# Patient Record
Sex: Female | Born: 2000 | Race: Black or African American | Hispanic: No | Smoking: Never smoker
Health system: Southern US, Community
[De-identification: ages and names within clinical notes are randomized; demographics above are authoritative.]

## PROBLEM LIST (undated history)

## (undated) DIAGNOSIS — L309 Dermatitis, unspecified: Secondary | ICD-10-CM

## (undated) HISTORY — PX: TONSILLECTOMY: SUR1361

---
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
## 2022-03-17 ENCOUNTER — Ambulatory Visit (HOSPITAL_COMMUNITY)
Admission: EM | Admit: 2022-03-17 | Discharge: 2022-03-17 | Disposition: A | Discharge status: Home / Self Care | Payer: Medicaid Other

## 2022-03-17 ENCOUNTER — Emergency Department (HOSPITAL_COMMUNITY)
Admission: EM | Admit: 2022-03-17 | Discharge: 2022-03-17 | Discharge status: Against Medical Advice | Payer: Medicaid Other | Attending: Emergency Medicine | Admitting: Emergency Medicine

## 2022-03-17 ENCOUNTER — Encounter (HOSPITAL_COMMUNITY): Payer: Self-pay | Admitting: Emergency Medicine

## 2022-03-17 ENCOUNTER — Other Ambulatory Visit: Payer: Self-pay

## 2022-03-17 DIAGNOSIS — H538 Other visual disturbances: Secondary | ICD-10-CM | POA: Insufficient documentation

## 2022-03-17 DIAGNOSIS — Z5321 Procedure and treatment not carried out due to patient leaving prior to being seen by health care provider: Secondary | ICD-10-CM | POA: Diagnosis not present

## 2022-03-17 DIAGNOSIS — R519 Headache, unspecified: Secondary | ICD-10-CM | POA: Insufficient documentation
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
## 2022-04-13 ENCOUNTER — Encounter (HOSPITAL_COMMUNITY): Payer: Self-pay

## 2022-04-13 ENCOUNTER — Ambulatory Visit (HOSPITAL_COMMUNITY)
Admission: EM | Admit: 2022-04-13 | Discharge: 2022-04-13 | Disposition: A | Discharge status: Home / Self Care | Payer: Medicaid Other | Attending: Physician Assistant | Admitting: Physician Assistant

## 2022-04-13 DIAGNOSIS — N898 Other specified noninflammatory disorders of vagina: Secondary | ICD-10-CM | POA: Diagnosis not present

## 2022-04-13 DIAGNOSIS — Z202 Contact with and (suspected) exposure to infections with a predominantly sexual mode of transmission: Secondary | ICD-10-CM | POA: Diagnosis not present

## 2022-04-13 LAB — HIV ANTIBODY (ROUTINE TESTING W REFLEX): HIV Screen 4th Generation wRfx: NONREACTIVE

## 2022-04-13 NOTE — ED Provider Notes (Signed)
[facility]    CSN: [id number] Arrival date & time: 04/13/22  3009      History   Chief Complaint Chief Complaint  Patient presents with   Exposure to STD    HPI Adriana Powell is a 21 y.o. female.   21 year old female presents for STI testing.  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  Patient relates that she is concerned because her partner is bisexual.  Patient relates that she has been having some vaginal discharge over the past several days which is white and thin, no odor, no itching.  Patient relates that her last period was at the end of April, and it was normal for her.  Patient does indicate that she has some irritation at the external vaginal area, she relates it may be secondary to some hair bump irritation, but she desires to have this checked because she is concerned about HSV.  No fever or chills.  No dysuria, or frequency.   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 Authorizing Provider  bacitracin ointment Apply 1 application topically 2 (two) times daily. 08/31/21   White, Elita Boone, NP  diphenhydrAMINE HCl 50 MG/30ML LIQD Take 15 mLs by mouth 4 (four) times daily as needed (Itching). 09/16/21   Theadora Rama Scales, PA-C  metroNIDAZOLE (FLAGYL) 500 MG tablet Take 1 tablet (500 mg total) by mouth 2 (two) times  daily. 09/01/21   Lamptey, Britta Mccreedy, MD  triamcinolone cream (KENALOG) 0.1 % Apply 1 application topically 2 (two) times daily. 05/09/21   Ivette Loyal, NP    Family History Family History  Problem Relation Age of Onset   Diabetes Paternal Grandmother     Social History Social History   Tobacco Use   Smoking status: Never    Passive exposure: Yes   Smokeless tobacco: Never  Vaping Use   Vaping Use: Never used  Substance Use Topics   Alcohol use: No   Drug use: No     Allergies   Hydrocodone   Review of Systems Review of Systems  Genitourinary:  Positive for genital sores and vaginal discharge.    Physical Exam Triage Vital Signs ED Triage Vitals  Enc Vitals Group     BP 04/13/22 1028 117/77     Pulse Rate 04/13/22 1028 60     Resp 04/13/22 1028 17     Temp 04/13/22 1028 98 F (36.7 C)     Temp src --      SpO2 04/13/22 1028 99 %     Weight --  Height --      Head Circumference --      Peak Flow --      Pain Score 04/13/22 1027 0     Pain Loc --      Pain Edu? --      Excl. in GC? --    No data found.  Updated Vital Signs BP 117/77   Pulse 60   Temp 98 F (36.7 C)   Resp 17   LMP 04/07/2022   SpO2 99%   Visual Acuity Right Eye Distance:   Left Eye Distance:   Bilateral Distance:    Right Eye Near:   Left Eye Near:    Bilateral Near:     Physical Exam Constitutional:      Appearance: Normal appearance.  Abdominal:     General: Abdomen is flat. Bowel sounds are normal.     Palpations: Abdomen is soft.     Tenderness: There is no abdominal tenderness. There is no guarding or rebound.  Genitourinary:      Comments: Pelvic: The right upper labia majora outer aspect there is a very small 2 mm x 1 mm braised area,Minimal redness,No drainage,rest of the exterior exam is normal. Neurological:     Mental Status: She is alert.  Psychiatric:        Behavior: Behavior is cooperative.     UC Treatments / Results  Labs (all labs  ordered are listed, but only abnormal results are displayed) Labs Reviewed  RPR  HIV ANTIBODY (ROUTINE TESTING W REFLEX)  CERVICOVAGINAL ANCILLARY ONLY    EKG   Radiology No results found.  Procedures Procedures (including critical care time)  Medications Ordered in UC Medications - No data to display  Initial Impression / Assessment and Plan / UC Course  I have reviewed the triage vital signs and the nursing notes.  Pertinent labs & imaging results that were available during my care of the patient were reviewed by me and considered in my medical decision making (see chart for details).    Plan: 1.  Advised to use precautions when engaging in sexual activity. 2.  Check MyChart in 48 hours to review results. 3.  Turn if symptoms fail to improve. Final Clinical Impressions(s) / UC Diagnoses   Final diagnoses:  STD exposure  Vaginal discharge   Discharge Instructions   None    ED Prescriptions   None    PDMP not reviewed this encounter.   Ellsworth Lennox, PA-C 04/13/22 1116

## 2022-04-13 NOTE — Discharge Instructions (Addendum)
Advised to continue to watch and be aware of any unusual symptoms.
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
## 2022-04-14 ENCOUNTER — Other Ambulatory Visit: Payer: Self-pay

## 2022-04-14 ENCOUNTER — Emergency Department (HOSPITAL_COMMUNITY)
Admission: EM | Admit: 2022-04-14 | Discharge: 2022-04-15 | Disposition: A | Discharge status: Home / Self Care | Payer: Medicaid Other | Attending: Emergency Medicine | Admitting: Emergency Medicine

## 2022-04-14 DIAGNOSIS — Z20822 Contact with and (suspected) exposure to covid-19: Secondary | ICD-10-CM | POA: Diagnosis not present

## 2022-04-14 DIAGNOSIS — R112 Nausea with vomiting, unspecified: Secondary | ICD-10-CM | POA: Insufficient documentation

## 2022-04-14 DIAGNOSIS — R45851 Suicidal ideations: Secondary | ICD-10-CM | POA: Diagnosis not present

## 2022-04-14 DIAGNOSIS — T391X2A Poisoning by 4-Aminophenol derivatives, intentional self-harm, initial encounter: Secondary | ICD-10-CM | POA: Insufficient documentation

## 2022-04-14 DIAGNOSIS — T50902A Poisoning by unspecified drugs, medicaments and biological substances, intentional self-harm, initial encounter: Secondary | ICD-10-CM

## 2022-04-14 DIAGNOSIS — D649 Anemia, unspecified: Secondary | ICD-10-CM | POA: Insufficient documentation

## 2022-04-14 DIAGNOSIS — T1491XA Suicide attempt, initial encounter: Secondary | ICD-10-CM

## 2022-04-14 DIAGNOSIS — T402X2A Poisoning by other opioids, intentional self-harm, initial encounter: Secondary | ICD-10-CM | POA: Insufficient documentation

## 2022-04-14 DIAGNOSIS — F322 Major depressive disorder, single episode, severe without psychotic features: Secondary | ICD-10-CM | POA: Diagnosis present

## 2022-04-14 LAB — CBC WITH DIFFERENTIAL/PLATELET
Abs Immature Granulocytes: 0.01 10*3/uL (ref 0.00–0.07)
Basophils Absolute: 0 10*3/uL (ref 0.0–0.1)
Basophils Relative: 0 %
Eosinophils Absolute: 0.1 10*3/uL (ref 0.0–0.5)
Eosinophils Relative: 1 %
HCT: 34.5 % — ABNORMAL LOW (ref 36.0–46.0)
Hemoglobin: 11 g/dL — ABNORMAL LOW (ref 12.0–15.0)
Immature Granulocytes: 0 %
Lymphocytes Relative: 38 %
Lymphs Abs: 2.6 10*3/uL (ref 0.7–4.0)
MCH: 28.1 pg (ref 26.0–34.0)
MCHC: 31.9 g/dL (ref 30.0–36.0)
MCV: 88.2 fL (ref 80.0–100.0)
Monocytes Absolute: 0.5 10*3/uL (ref 0.1–1.0)
Monocytes Relative: 7 %
Neutro Abs: 3.8 10*3/uL (ref 1.7–7.7)
Neutrophils Relative %: 54 %
Platelets: 268 10*3/uL (ref 150–400)
RBC: 3.91 MIL/uL (ref 3.87–5.11)
RDW: 13.2 % (ref 11.5–15.5)
WBC: 7 10*3/uL (ref 4.0–10.5)
nRBC: 0 % (ref 0.0–0.2)

## 2022-04-14 LAB — COMPREHENSIVE METABOLIC PANEL
ALT: 12 U/L (ref 0–44)
AST: 15 U/L (ref 15–41)
Albumin: 3.5 g/dL (ref 3.5–5.0)
Alkaline Phosphatase: 57 U/L (ref 38–126)
Anion gap: 6 (ref 5–15)
BUN: 10 mg/dL (ref 6–20)
CO2: 25 mmol/L (ref 22–32)
Calcium: 8.7 mg/dL — ABNORMAL LOW (ref 8.9–10.3)
Chloride: 108 mmol/L (ref 98–111)
Creatinine, Ser: 0.79 mg/dL (ref 0.44–1.00)
GFR, Estimated: >60 mL/min (ref >60)
Glucose, Bld: 94 mg/dL (ref 70–99)
Potassium: 3.4 mmol/L — ABNORMAL LOW (ref 3.5–5.1)
Sodium: 139 mmol/L (ref 135–145)
Total Bilirubin: 0.5 mg/dL (ref 0.3–1.2)
Total Protein: 6.8 g/dL (ref 6.5–8.1)

## 2022-04-14 LAB — I-STAT BETA HCG BLOOD, ED (MC, WL, AP ONLY): I-stat hCG, quantitative: <5 m[IU]/mL (ref <5)

## 2022-04-14 LAB — RPR
RPR Ser Ql: REACTIVE — AB
RPR Titer: 1:2 {titer}

## 2022-04-14 MED ORDER — ONDANSETRON 4 MG PO TBDP
4.0000 mg | ORAL_TABLET | Freq: Once | ORAL | Status: AC
  Administered 2022-04-14: 4 mg via ORAL
  Filled 2022-04-14: qty 1

## 2022-04-14 NOTE — ED Notes (Signed)
Pt given burgundy scrubs and instructed to change into them. RN at bedside.

## 2022-04-14 NOTE — ED Notes (Signed)
Pt dressed out into scrubs. Pt belongings collected and stored in cabinet labeled patient belongins 16-18/resus A. Security notified and patient was wanded.  Bag 1: backwoods toboggan, crocs, phone, bra, pants t-shirt

## 2022-04-14 NOTE — ED Notes (Signed)
Pt called out and states that she feels like she is having an allergic reaction from hydrocodone syrup and is experiencing itching. Denies SOB or throat swelling. Theron Arista, PA notified.

## 2022-04-14 NOTE — ED Provider Notes (Signed)
[facility]EMERGENCY DEPT Provider Note   CSN: [id number] Arrival date & time: 04/14/22  2203     History {Add pertinent medical, surgical, social history, OB history to HPI:1} Chief Complaint  Patient presents with   Drug Overdose    Adriana Powell is a 21 y.o. female.   Drug Overdose      Home Medications Prior to Admission medications   Medication Sig Start Date End Date Taking? Authorizing Provider  bacitracin ointment Apply 1 application topically 2 (two) times daily. 08/31/21   White, Elita Boone, NP  diphenhydrAMINE HCl 50 MG/30ML LIQD Take 15 mLs by mouth 4 (four) times daily as needed (Itching). 09/16/21   Theadora Rama Scales, PA-C  metroNIDAZOLE (FLAGYL) 500 MG tablet Take 1 tablet (500 mg total) by mouth 2 (two) times daily. 09/01/21   Lamptey, Britta Mccreedy, MD  triamcinolone cream (KENALOG) 0.1 % Apply 1 application topically 2 (two) times daily. 05/09/21   Ivette Loyal, NP      Allergies    Hydrocodone    Review of Systems   Review of Systems  Physical Exam Updated Vital Signs BP 127/81   Pulse (!) 56   Resp 17   Ht 5\' 3"  (1.6 m)   Wt 81.6 kg   LMP 04/07/2022   SpO2 100%   BMI 31.89 kg/m  Physical Exam  ED Results / Procedures / Treatments   Labs (all labs ordered are listed, but only abnormal results are displayed) Labs Reviewed  COMPREHENSIVE METABOLIC PANEL - Abnormal; Notable for the following components:      Result Value   Potassium 3.4 (*)    Calcium 8.7 (*)    All other components within normal limits  CBC WITH DIFFERENTIAL/PLATELET - Abnormal; Notable for the following components:   Hemoglobin 11.0 (*)    HCT 34.5 (*)    All other components within normal limits  RESP PANEL BY RT-PCR (FLU A&B, COVID) ARPGX2  RAPID URINE DRUG SCREEN, HOSP PERFORMED  ACETAMINOPHEN LEVEL  ETHANOL  SALICYLATE LEVEL  I-STAT BETA HCG BLOOD, ED ([facility], [facility], [facility] ONLY)    EKG EKG Interpretation  Date/Time:  Saturday Apr 14 2022  22:20:04 EDT Ventricular Rate:  84 PR Interval:  185 QRS Duration: 77 QT Interval:  351 QTC Calculation: 415 R Axis:   18 Text Interpretation: Sinus rhythm No old tracing to compare Confirmed by [birth year] [phone]) on 04/14/2022 10:40:46 PM  Radiology No results found.  Procedures Procedures  {Document cardiac monitor, telemetry assessment procedure when appropriate:1}  Medications Ordered in ED Medications  ondansetron (ZOFRAN-ODT) disintegrating tablet 4 mg (4 mg Oral Given 04/14/22 2258)    ED Course/ Medical Decision Making/ A&P                           Medical Decision Making Amount and/or Complexity of Data Reviewed Labs: ordered.  Risk Prescription drug management.   ***  {Document critical care time when appropriate:1} {Document review of labs and clinical decision tools ie heart score, Chads2Vasc2 etc:1}  {Document your independent review of radiology images, and any outside records:1} {Document your discussion with family members, caretakers, and with consultants:1} {Document social determinants of health affecting pt's care:1} {Document your decision making why or why not admission, treatments were needed:1} Final Clinical Impression(s) / ED Diagnoses Final diagnoses:  None    Rx / DC Orders ED Discharge Orders     None

## 2022-04-14 NOTE — ED Provider Notes (Incomplete)
[facility]-EMER[facility]ENCY DEPT Provider Note   CSN: [id number] Arrival date & time: 04/14/22  2203     History {Add pertinent medical, surgical, social history, OB history to HPI:1} Chief Complaint  Patient presents with  . Drug Overdose    Ralphine Hinks is a 21 y.o. female with no reported past medical history.  Presents to the emergency department chief complaint of suicidal ideations and attempted overdose.  Patient states that she took approximately 100 mL of a hydrocodone acetaminophen solution with a concentration of 7.5-325mg /82mL.  Patient also reports that she took 2 Percocet pills of unknown strength, smoked marijuana, and had "4 gulps of wine."  Patient took these medications at approximately 8:45 PM this evening.  Patient took this in attempt to harm herself.  Patient states that she has suicidal ideations on a daily basis and she is 14.  Patient states that she attempted suicide tonight due to herpes exposure from her her girlfriend.  Patient denies any psychiatric medications or counseling in the outpatient setting.  Denies any homicidal ideations, auditory hallucinations, or visual hallucinations.  Patient denies any previous suicide attempt however states that she has cut her self in the past as a teenager.  Patient endorses nausea and vomiting.  States that she has vomited twice since being in the emergency department.  Denies any other complaints.   Drug Overdose Pertinent negatives include no chest pain, no abdominal pain, no headaches and no shortness of breath.      Home Medications Prior to Admission medications   Medication Sig Start Date End Date Taking? Authorizing Provider  bacitracin ointment Apply 1 application topically 2 (two) times daily. 08/31/21   White, Elita Boone, NP  diphenhydrAMINE HCl 50 MG/30ML LIQD Take 15 mLs by mouth 4 (four) times daily as needed (Itching). 09/16/21   Theadora Rama Scales, PA-C  metroNIDAZOLE (FLAGYL) 500  MG tablet Take 1 tablet (500 mg total) by mouth 2 (two) times daily. 09/01/21   Lamptey, Britta Mccreedy, MD  triamcinolone cream (KENALOG) 0.1 % Apply 1 application topically 2 (two) times daily. 05/09/21   Ivette Loyal, NP      Allergies    Hydrocodone    Review of Systems   Review of Systems  Constitutional:  Negative for chills and fever.  Eyes:  Negative for visual disturbance.  Respiratory:  Negative for shortness of breath.   Cardiovascular:  Negative for chest pain.  Gastrointestinal:  Positive for nausea and vomiting. Negative for abdominal pain.  Genitourinary:  Negative for difficulty urinating and dysuria.  Musculoskeletal:  Negative for back pain and neck pain.  Skin:  Negative for color change and rash.  Neurological:  Negative for dizziness, syncope, light-headedness and headaches.  Psychiatric/Behavioral:  Positive for self-injury and suicidal ideas. Negative for confusion and hallucinations.    Physical Exam Updated Vital Signs BP 127/81   Pulse (!) 56   Resp 17   Ht 5\' 3"  (1.6 m)   Wt 81.6 kg   LMP 04/07/2022   SpO2 100%   BMI 31.89 kg/m  Physical Exam Vitals and nursing note reviewed.  Constitutional:      General: She is awake. She is not in acute distress.    Appearance: She is not ill-appearing, toxic-appearing or diaphoretic.  HENT:     Head: Normocephalic.  Eyes:     General: No scleral icterus.       Right eye: No discharge.        Left eye: No discharge.  Cardiovascular:     Rate and Rhythm: Normal rate.     Pulses:          Radial pulses are 2+ on the right side and 2+ on the left side.  Pulmonary:     Effort: Pulmonary effort is normal. No tachypnea or respiratory distress.     Breath sounds: Normal breath sounds.     Comments: Speaks in full complete sentences without difficulty Abdominal:     General: Abdomen is flat. There is no distension. There are no signs of injury.     Palpations: Abdomen is soft. There is no mass or pulsatile mass.      Tenderness: There is no abdominal tenderness. There is no guarding or rebound.     Hernia: There is no hernia in the umbilical area or ventral area.  Skin:    General: Skin is warm and dry.  Neurological:     General: No focal deficit present.     Mental Status: She is alert and oriented to person, place, and time.     GCS: GCS eye subscore is 4. GCS verbal subscore is 5. GCS motor subscore is 6.  Psychiatric:        Behavior: Behavior is cooperative.    ED Results / Procedures / Treatments   Labs (all labs ordered are listed, but only abnormal results are displayed) Labs Reviewed  COMPREHENSIVE METABOLIC PANEL - Abnormal; Notable for the following components:      Result Value   Potassium 3.4 (*)    Calcium 8.7 (*)    All other components within normal limits  CBC WITH DIFFERENTIAL/PLATELET - Abnormal; Notable for the following components:   Hemoglobin 11.0 (*)    HCT 34.5 (*)    All other components within normal limits  RESP PANEL BY RT-PCR (FLU A&B, COVID) ARPGX2  RAPID URINE DRUG SCREEN, HOSP PERFORMED  ACETAMINOPHEN LEVEL  ETHANOL  SALICYLATE LEVEL  I-STAT BETA HCG BLOOD, ED ([facility], [facility], [facility] ONLY)    EKG EKG Interpretation  Date/Time:  Saturday Apr 14 2022 22:20:04 EDT Ventricular Rate:  84 PR Interval:  185 QRS Duration: 77 QT Interval:  351 QTC Calculation: 415 R Axis:   18 Text Interpretation: Sinus rhythm No old tracing to compare Confirmed by Mancel Bale [phone]) on 04/14/2022 10:40:46 PM  Radiology No results found.  Procedures Procedures  {Document cardiac monitor, telemetry assessment procedure when appropriate:1}  Medications Ordered in ED Medications  ondansetron (ZOFRAN-ODT) disintegrating tablet 4 mg (4 mg Oral Given 04/14/22 2258)    ED Course/ Medical Decision Making/ A&P                           Medical Decision Making Amount and/or Complexity of Data Reviewed Labs: ordered.  Risk Prescription drug management.   Alert  21 year old female in no acute distress, nontoxic-appearing.  Patient is not sedated or drowsy.  Presents to the emergency department the chief complaint of suicidal ideation and attempted overdose.  Information obtained from patient and patient's girlfriend at bedside.  Past medical records reviewed including previous provider notes, and labs.  {Document critical care time when appropriate:1} {Document review of labs and clinical decision tools ie heart score, Chads2Vasc2 etc:1}  {Document your independent review of radiology images, and any outside records:1} {Document your discussion with family members, caretakers, and with consultants:1} {Document social determinants of health affecting pt's care:1} {Document your decision making why or why not admission, treatments were needed:1}  Final Clinical Impression(s) / ED Diagnoses Final diagnoses:  None    Rx / DC Orders ED Discharge Orders     None

## 2022-04-14 NOTE — ED Triage Notes (Signed)
Pt bib by [facility] from home, c/o SI and overdose. Pt drank approx of hydrocodone/acetaminophen 7.5-325/47ml suspension. Pt also took 2 oxycodone, drank alcohol and used marijuana. During triage, pt vomited 600 ml. Pt consumed everything at around 2100.

## 2022-04-15 DIAGNOSIS — F322 Major depressive disorder, single episode, severe without psychotic features: Secondary | ICD-10-CM | POA: Diagnosis present

## 2022-04-15 DIAGNOSIS — R45851 Suicidal ideations: Secondary | ICD-10-CM

## 2022-04-15 LAB — ETHANOL: Alcohol, Ethyl (B): <10 mg/dL (ref <10)

## 2022-04-15 LAB — RESP PANEL BY RT-PCR (FLU A&B, COVID) ARPGX2
Influenza A by PCR: NEGATIVE
Influenza B by PCR: NEGATIVE
SARS Coronavirus 2 by RT PCR: NEGATIVE

## 2022-04-15 LAB — ACETAMINOPHEN LEVEL: Acetaminophen (Tylenol), Serum: <10 ug/mL — ABNORMAL LOW (ref 10–30)

## 2022-04-15 LAB — RAPID URINE DRUG SCREEN, HOSP PERFORMED
Amphetamines: NOT DETECTED
Barbiturates: NOT DETECTED
Benzodiazepines: NOT DETECTED
Cocaine: NOT DETECTED
Opiates: POSITIVE — AB
Tetrahydrocannabinol: POSITIVE — AB

## 2022-04-15 LAB — SALICYLATE LEVEL: Salicylate Lvl: <7 mg/dL — ABNORMAL LOW (ref 7.0–30.0)

## 2022-04-15 MED ORDER — HYDROXYZINE HCL 25 MG PO TABS: 25.0000 mg | ORAL_TABLET | Freq: Three times a day (TID) | ORAL | 0 refills | Status: AC | PRN

## 2022-04-15 MED ORDER — HYDROXYZINE HCL 25 MG PO TABS: 25.0000 mg | ORAL_TABLET | Freq: Three times a day (TID) | ORAL | Status: DC | PRN

## 2022-04-15 MED ORDER — FLUOXETINE HCL 10 MG PO CAPS
10.0000 mg | ORAL_CAPSULE | Freq: Every day | ORAL | Status: DC
  Administered 2022-04-15: 10 mg via ORAL
  Filled 2022-04-15: qty 1

## 2022-04-15 MED ORDER — FLUOXETINE HCL 10 MG PO CAPS: 10.0000 mg | ORAL_CAPSULE | Freq: Every day | ORAL | 0 refills | Status: AC

## 2022-04-15 NOTE — Consult Note (Addendum)
[facility] ED ASSESSMENT   Reason for Consult:  Psychiatric evaluation Referring Physician:  ER Physician Patient Identification: Adriana Powell MRN:  [id number] ED Chief Complaint: Suicide ideation  Diagnosis:  Principal Problem:   Suicide ideation Active Problems:   Severe single current episode of major depressive disorder, without psychotic features [facility])   ED Assessment Time Calculation: No data recorded  Subjective:   Adriana Powell is a 21 y.o. female patient admitted with no prior Mental health diagnosis presented to ER vis EMS last night after OD.  She reported  approximately 100 mL of a hydrocodone acetaminophen solution with a concentration of 7.5-325mg /80mL.  Patient also reports that she took 2 Percocet pills of unknown strength, smoked marijuana, and had some wine.   At the time her intention was to die because she was worried she might have contracted STD-Herpes.  HPI:  Patient was seen awake, alert and engaged in meaning conversation.  This morning she denied her intention for taking the above mentioned was to kill herself.  She reported that she has been feeling depressed, anxious and lonely although she has a girl friend.  She also reported that she has been having issues with her girlfriend since she contracted Herpes herself.  She reported that her OD was was to "Numb my pain"  She vehemently denied wanting to kill herself.  She want treatment for depression and therapy as well stating that she allows things inside her mind to bother her without discussing with her mother.  She is currently unemployed and lives partly with her mom and her girl Friend.  She has been cleared by Poison control, V/S ar normal and she has contracted for safety. Provider spoke to patient's mother who reported that she is out of town and cannot monitor her daughter until she comes back.  Patient's aunt declined keeping patient for now.  We will monitor overnight and discharge tomorrow.  At this time she  does not meet criteria for inpatient Hospitalization due to the fact she is not experiencing any consequences from the OD.  We will offer outpatient Mental health care referrals with therapy on discharge.  Past Psychiatric History: denied, none  Risk to Self or Others: Is the patient at risk to self? No Has the patient been a risk to self in the past 6 months? Yes Has the patient been a risk to self within the distant past? Yes Is the patient a risk to others? No Has the patient been a risk to others in the past 6 months? No Has the patient been a risk to others within the distant past? No  [place] Scale:  Flowsheet Row ED from 04/14/2022 in [facility] from 04/13/2022 in [facility] ED from 03/17/2022 in [facility] EMERGENCY DEPARTMENT  C-SSRS RISK CATEGORY High Risk No Risk No Risk       AIMS:  , , ,  ,   ASAM:    Substance Abuse:     Past Medical History:  Past Medical History:  Diagnosis Date   Eczema     Past Surgical History:  Procedure Laterality Date   TONSILLECTOMY     Family History:  Family History  Problem Relation Age of Onset   Diabetes Paternal Grandmother    Family Psychiatric  History: denies Social History:  Social History   Substance and Sexual Activity  Alcohol Use No     Social History   Substance and Sexual Activity  Drug Use No    Social History   Socioeconomic History   Marital status: Significant Other    Spouse name: Not on file   Number of children: Not on file   Years of education: Not on file   Highest education level: Not on file  Occupational History   Not on file  Tobacco Use   Smoking status: Never    Passive exposure: Yes   Smokeless tobacco: Never  Vaping Use   Vaping Use: Never used  Substance and Sexual Activity   Alcohol use: No   Drug use: No   Sexual activity: Not Currently    Birth control/protection: None  Other Topics Concern    Not on file  Social History Narrative   Not on file   Social Determinants of Health   Financial Resource Strain: Not on file  Food Insecurity: Not on file  Transportation Needs: Not on file  Physical Activity: Not on file  Stress: Not on file  Social Connections: Not on file   Additional Social History:    Allergies:   Allergies  Allergen Reactions   Hydrocodone Anaphylaxis    Labs:  Results for orders placed or performed during the hospital encounter of 04/14/22 (from the past 48 hour(s))  Acetaminophen level     Status: Abnormal   Collection Time: 04/14/22 12:57 AM  Result Value Ref Range   Acetaminophen (Tylenol), Serum <10 (L) 10 - 30 ug/mL    Comment: (NOTE) Therapeutic concentrations vary significantly. A range of 10-30 ug/mL  may be an effective concentration for many patients. However, some  are best treated at concentrations outside of this range. Acetaminophen concentrations >150 ug/mL at 4 hours after ingestion  and >50 ug/mL at 12 hours after ingestion are often associated with  toxic reactions.  Performed at [facility], [address] [place], [place] [address]   Ethanol     Status: None   Collection Time: 04/14/22 12:57 AM  Result Value Ref Range   Alcohol, Ethyl (B) <10 <10 mg/dL    Comment: (NOTE) Lowest detectable limit for serum alcohol is 10 mg/dL.  For medical purposes only. Performed at [facility], [address], [place], [place] [address]   Salicylate level     Status: Abnormal   Collection Time: 04/14/22 12:57 AM  Result Value Ref Range   Salicylate Lvl <7.0 (L) 7.0 - 30.0 mg/dL    Comment: Performed at [facility], [address] [place], [place] [address]  Comprehensive metabolic panel     Status: Abnormal   Collection Time: 04/14/22 10:32 PM  Result Value Ref Range   Sodium 139 135 - 145 mmol/L   Potassium 3.4 (L) 3.5 - 5.1 mmol/L   Chloride 108 98 - 111 mmol/L    CO2 25 22 - 32 mmol/L   Glucose, Bld 94 70 - 99 mg/dL    Comment: Glucose reference range applies only to samples taken after fasting for at least 8 hours.   BUN 10 6 - 20 mg/dL   Creatinine, Ser 5.46 0.44 - 1.00 mg/dL   Calcium 8.7 (L) 8.9 - 10.3 mg/dL   Total Protein 6.8 6.5 - 8.1 g/dL   Albumin 3.5 3.5 - 5.0 g/dL   AST 15 15 - 41 U/L   ALT 12 0 - 44 U/L   Alkaline Phosphatase 57 38 - 126 U/L   Total Bilirubin 0.5 0.3 - 1.2 mg/dL   GFR, Estimated >27 >03 mL/min  Comment: (NOTE) Calculated using the CKD-EPI Creatinine Equation (2021)    Anion gap 6 5 - 15    Comment: Performed at [facility], [address], [place], [place] [address]  CBC with Diff     Status: Abnormal   Collection Time: 04/14/22 10:32 PM  Result Value Ref Range   WBC 7.0 4.0 - 10.5 K/uL   RBC 3.91 3.87 - 5.11 MIL/uL   Hemoglobin 11.0 (L) 12.0 - 15.0 g/dL   HCT 60.4 (L) 54.0 - 98.1 %   MCV 88.2 80.0 - 100.0 fL   MCH 28.1 26.0 - 34.0 pg   MCHC 31.9 30.0 - 36.0 g/dL   RDW 19.1 47.8 - 29.5 %   Platelets 268 150 - 400 K/uL   nRBC 0.0 0.0 - 0.2 %   Neutrophils Relative % 54 %   Neutro Abs 3.8 1.7 - 7.7 K/uL   Lymphocytes Relative 38 %   Lymphs Abs 2.6 0.7 - 4.0 K/uL   Monocytes Relative 7 %   Monocytes Absolute 0.5 0.1 - 1.0 K/uL   Eosinophils Relative 1 %   Eosinophils Absolute 0.1 0.0 - 0.5 K/uL   Basophils Relative 0 %   Basophils Absolute 0.0 0.0 - 0.1 K/uL   Immature Granulocytes 0 %   Abs Immature Granulocytes 0.01 0.00 - 0.07 K/uL    Comment: Performed at [facility], [address], [place], [place] [address]  I-Stat beta hCG blood, ED     Status: None   Collection Time: 04/14/22 10:47 PM  Result Value Ref Range   I-stat hCG, quantitative <5.0 <5 mIU/mL   Comment 3            Comment:   GEST. AGE      CONC.  (mIU/mL)   <=1 WEEK        5 - 50     2 WEEKS       50 - 500     3 WEEKS       100 - 10,000     4 WEEKS     1,000 - 30,000        FEMALE AND  NON-PREGNANT FEMALE:     LESS THAN 5 mIU/mL   Resp Panel by RT-PCR (Flu A&B, Covid) Anterior Nasal Swab     Status: None   Collection Time: 04/14/22 11:00 PM   Specimen: Anterior Nasal Swab  Result Value Ref Range   SARS Coronavirus 2 by RT PCR NEGATIVE NEGATIVE    Comment: (NOTE) SARS-CoV-2 target nucleic acids are NOT DETECTED.  The SARS-CoV-2 RNA is generally detectable in upper respiratory specimens during the acute phase of infection. The lowest concentration of SARS-CoV-2 viral copies this assay can detect is 138 copies/mL. A negative result does not preclude SARS-Cov-2 infection and should not be used as the sole basis for treatment or other patient management decisions. A negative result may occur with  improper specimen collection/handling, submission of specimen other than nasopharyngeal swab, presence of viral mutation(s) within the areas targeted by this assay, and inadequate number of viral copies(<138 copies/mL). A negative result must be combined with clinical observations, patient history, and epidemiological information. The expected result is Negative.  Fact Sheet for Patients:  BloggerCourse.com  Fact Sheet for Healthcare Providers:  SeriousBroker.it  This test is no t yet approved or cleared by the [place] FDA and  has been authorized for detection and/or diagnosis of SARS-CoV-2 by FDA under an Emergency Use Authorization (  EUA). This EUA will remain  in effect (meaning this test can be used) for the duration of the COVID-19 declaration under Section 564(b)(1) of the Act, 21 U.S.C.section 360bbb-3(b)(1), unless the authorization is terminated  or revoked sooner.       Influenza A by PCR NEGATIVE NEGATIVE   Influenza B by PCR NEGATIVE NEGATIVE    Comment: (NOTE) The Xpert Xpress SARS-CoV-2/FLU/RSV plus assay is intended as an aid in the diagnosis of influenza from Nasopharyngeal swab specimens  and should not be used as a sole basis for treatment. Nasal washings and aspirates are unacceptable for Xpert Xpress SARS-CoV-2/FLU/RSV testing.  Fact Sheet for Patients: BloggerCourse.com  Fact Sheet for Healthcare Providers: SeriousBroker.it  This test is not yet approved or cleared by the [place] FDA and has been authorized for detection and/or diagnosis of SARS-CoV-2 by FDA under an Emergency Use Authorization (EUA). This EUA will remain in effect (meaning this test can be used) for the duration of the COVID-19 declaration under Section 564(b)(1) of the Act, 21 U.S.C. section 360bbb-3(b)(1), unless the authorization is terminated or revoked.  Performed at [facility], [address] [place], [place] [address]   Urine rapid drug screen (hosp performed)     Status: Abnormal   Collection Time: 04/14/22 11:12 PM  Result Value Ref Range   Opiates POSITIVE (A) NONE DETECTED   Cocaine NONE DETECTED NONE DETECTED   Benzodiazepines NONE DETECTED NONE DETECTED   Amphetamines NONE DETECTED NONE DETECTED   Tetrahydrocannabinol POSITIVE (A) NONE DETECTED   Barbiturates NONE DETECTED NONE DETECTED    Comment: (NOTE) DRUG SCREEN FOR MEDICAL PURPOSES ONLY.  IF CONFIRMATION IS NEEDED FOR ANY PURPOSE, NOTIFY LAB WITHIN 5 DAYS.  LOWEST DETECTABLE LIMITS FOR URINE DRUG SCREEN Drug Class                     Cutoff (ng/mL) Amphetamine and metabolites    1000 Barbiturate and metabolites    200 Benzodiazepine                 200 Tricyclics and metabolites     300 Opiates and metabolites        300 Cocaine and metabolites        300 THC                            50 Performed at [facility], [address] [place], [place] [address]     Current Facility-Administered Medications  Medication Dose Route Frequency Provider Last Rate Last Admin   FLUoxetine (PROZAC) capsule 10 mg  10 mg Oral  Daily Aleczander Fandino C, NP       hydrOXYzine (ATARAX) tablet 25 mg  25 mg Oral TID PRN Earney Navy, NP       No current outpatient medications on file.    Musculoskeletal: Strength & Muscle Tone: within normal limits Gait & Station: normal Patient leans: Front   Psychiatric Specialty Exam: Presentation  General Appearance: Appropriate for Environment; Casual; Neat  Eye Contact:Good  Speech:Clear and Coherent; Normal Rate  Speech Volume:Normal  Handedness:Right   Mood and Affect  Mood:Depressed; Anxious  Affect:Congruent   Thought Process  Thought Processes:Coherent; Goal Directed  Descriptions of Associations:Intact  Orientation:Full (Time, Place and Person)  Thought Content:Logical  History of Schizophrenia/Schizoaffective disorder:No data recorded Duration of Psychotic Symptoms:No data recorded Hallucinations:Hallucinations: None  Ideas of Reference:None  Suicidal Thoughts:Suicidal Thoughts: No  Homicidal Thoughts:Homicidal Thoughts: No   Sensorium  Memory:Immediate Good; Recent Good; Remote Good  Judgment:Fair  Insight:Good   Executive Functions  Concentration:Good  Attention Span:Good  Recall:Good  Fund of Knowledge:Fair  Language:Good   Psychomotor Activity  Psychomotor Activity:Psychomotor Activity: Normal   Assets  Assets:Communication Skills; Desire for Improvement; Housing; Intimacy; Physical Health    Sleep  Sleep:Sleep: Fair   Physical Exam: Physical Exam Vitals and nursing note reviewed.  Constitutional:      Appearance: Normal appearance.  HENT:     Head: Normocephalic and atraumatic.     Nose: Nose normal.  Cardiovascular:     Rate and Rhythm: Normal rate and regular rhythm.  Pulmonary:     Effort: Pulmonary effort is normal.  Musculoskeletal:        General: Normal range of motion.     Cervical back: Normal range of motion.  Skin:    General: Skin is warm and dry.  Neurological:      General: No focal deficit present.     Mental Status: She is alert and oriented to person, place, and time.   Review of Systems  Constitutional: Negative.   HENT: Negative.    Eyes: Negative.   Respiratory: Negative.    Cardiovascular: Negative.   Gastrointestinal: Negative.   Genitourinary: Negative.   Musculoskeletal: Negative.   Skin: Negative.   Neurological: Negative.   Endo/Heme/Allergies: Negative.   Psychiatric/Behavioral:  Positive for depression and suicidal ideas. The patient is nervous/anxious.   Blood pressure 119/66, pulse 73, temperature 98 F (36.7 C), temperature source Oral, resp. rate 16, height 5\' 3"  (1.6 m), weight 81.6 kg, last menstrual period 04/07/2022, SpO2 100 %. Body mass index is 31.89 kg/m.  Medical Decision Making: Will monitor today and plan to discharge home tomorrow to continue Mental health care in the outpatient setting.  We will offer resources for therapy as well on discharge. ADDENDUM:  Provider spent time with patient and girlfriend and we discussed safety, therapist and outpatient Psychiatric care.  She promised to engage in therapy and to continue Mental health treatment in the  community.  Patient and girlfriend are parents to a son and both takes care of the baby.  Patient adamantly denied SI/HI/AVH.  Girl Friend will spend time with her and will not leave her alone.  [facility] to add extra resources. Problem 1: Single episode Major Depressive disorder, severe without Psychotic features  Problem 2: Suicide attempt  Problem 3: Anxiety disorder  Disposition:  Discharge home.   Start low dose Prozac and Hydroxyzine for Depression and anxiety.  Earney NavyJosephine C Santez Woodcox, NP-PMHNP-BC 04/15/2022 1:24 PM

## 2022-04-15 NOTE — ED Notes (Signed)
Spoke with Poison Control on the phone and they state that pt is medically clear per their guidelines, but to call back with any further questions or concerns.

## 2022-04-16 LAB — HSV CULTURE AND TYPING

## 2022-04-17 LAB — T.PALLIDUM AB, TOTAL: T Pallidum Abs: REACTIVE — AB

## 2022-10-14 ENCOUNTER — Encounter (HOSPITAL_COMMUNITY): Payer: Self-pay

## 2022-10-14 ENCOUNTER — Other Ambulatory Visit: Payer: Self-pay

## 2022-10-14 ENCOUNTER — Emergency Department (HOSPITAL_COMMUNITY)
Admission: EM | Admit: 2022-10-14 | Discharge: 2022-10-14 | Discharge status: Against Medical Advice | Payer: Medicaid Other | Attending: Emergency Medicine | Admitting: Emergency Medicine

## 2022-10-14 DIAGNOSIS — R059 Cough, unspecified: Secondary | ICD-10-CM | POA: Diagnosis present

## 2022-10-14 DIAGNOSIS — Z5321 Procedure and treatment not carried out due to patient leaving prior to being seen by health care provider: Secondary | ICD-10-CM | POA: Insufficient documentation

## 2022-10-14 DIAGNOSIS — Z20822 Contact with and (suspected) exposure to covid-19: Secondary | ICD-10-CM | POA: Insufficient documentation

## 2022-10-14 DIAGNOSIS — R0981 Nasal congestion: Secondary | ICD-10-CM | POA: Diagnosis not present

## 2022-10-14 LAB — SARS CORONAVIRUS 2 BY RT PCR: SARS Coronavirus 2 by RT PCR: NEGATIVE

## 2022-10-14 NOTE — ED Triage Notes (Signed)
Patient complains of cough and congestion for 4 days, her son has been sick and friends as well. Alert and oriented, NAD

## 2022-10-14 NOTE — ED Provider Triage Note (Signed)
Emergency Medicine Provider Triage Evaluation Note  Adriana Powell , a 21 y.o. female  was evaluated in triage.  Pt complains of congestion and cough.  Started 3 days ago.  States that her son recently diagnosed with RSV.  Denies fever, shortness of breath or chest pain.  Also endorses generalized bodyaches.  Review of Systems  Positive: See above Negative: See above  Physical Exam  BP 126/70 (BP Location: Left Arm)   Pulse (!) 103   Temp 98.3 F (36.8 C) (Oral)   Resp 16   SpO2 100%  Gen:   Awake, no distress   Resp:  Normal effort  MSK:   Moves extremities without difficulty  Other:    Medical Decision Making  Medically screening exam initiated at 11:44 AM.  Appropriate orders placed.  Adriana Powell was informed that the remainder of the evaluation will be completed by another provider, this initial triage assessment does not replace that evaluation, and the importance of remaining in the ED until their evaluation is complete.  Work up [employer], Sharlett Iles, PA-C 10/14/22 1146

## 2022-10-14 NOTE — ED Notes (Signed)
Pt left with other pt requesting AVS paperwork. And did not want to stay to see doctor for results. Pt seen leaving the ED.

## 2022-10-15 LAB — RESP PANEL BY RT-PCR (FLU A&B, COVID) ARPGX2
Influenza A by PCR: NEGATIVE
Influenza B by PCR: NEGATIVE
SARS Coronavirus 2 by RT PCR: NEGATIVE

## 2023-11-11 ENCOUNTER — Emergency Department (HOSPITAL_COMMUNITY)
Admission: EM | Admit: 2023-11-11 | Discharge: 2023-11-12 | Disposition: A | Discharge status: Home / Self Care | Payer: Medicaid Other | Attending: Emergency Medicine | Admitting: Emergency Medicine

## 2023-11-11 ENCOUNTER — Other Ambulatory Visit: Payer: Self-pay

## 2023-11-11 ENCOUNTER — Encounter (HOSPITAL_COMMUNITY): Payer: Self-pay

## 2023-11-11 DIAGNOSIS — Z202 Contact with and (suspected) exposure to infections with a predominantly sexual mode of transmission: Secondary | ICD-10-CM | POA: Diagnosis present

## 2023-11-11 LAB — URINALYSIS, ROUTINE W REFLEX MICROSCOPIC
Bacteria, UA: NONE SEEN
Bilirubin Urine: NEGATIVE
Glucose, UA: NEGATIVE mg/dL
Hgb urine dipstick: NEGATIVE
Ketones, ur: 5 mg/dL — AB
Nitrite: NEGATIVE
Protein, ur: NEGATIVE mg/dL
Specific Gravity, Urine: 1.024 (ref 1.005–1.030)
pH: 6 (ref 5.0–8.0)

## 2023-11-11 LAB — HCG, SERUM, QUALITATIVE: Preg, Serum: NEGATIVE

## 2023-11-11 NOTE — ED Triage Notes (Signed)
Pt arrived from home via POV c/o needing STD check. Pt states that her partner tested positive for trich and gonorrhea and chlamydia. Pt would like to have a complete panel done. Pt denies any discharge, pain, or itching.

## 2023-11-12 LAB — GC/CHLAMYDIA PROBE AMP (~~LOC~~) NOT AT ARMC
Chlamydia: POSITIVE — AB
Comment: NEGATIVE
Comment: NORMAL
Neisseria Gonorrhea: POSITIVE — AB

## 2023-11-12 LAB — WET PREP, GENITAL
Clue Cells Wet Prep HPF POC: NONE SEEN
Sperm: NONE SEEN
Trich, Wet Prep: NONE SEEN
WBC, Wet Prep HPF POC: >=10 — AB (ref <10)

## 2023-11-12 LAB — RPR
RPR Ser Ql: REACTIVE — AB
RPR Titer: 1:1 {titer}

## 2023-11-12 LAB — HIV ANTIBODY (ROUTINE TESTING W REFLEX): HIV Screen 4th Generation wRfx: NONREACTIVE

## 2023-11-12 MED ORDER — DOXYCYCLINE HYCLATE 100 MG PO TABS
100.0000 mg | ORAL_TABLET | Freq: Once | ORAL | Status: AC
  Administered 2023-11-12: 100 mg via ORAL
  Filled 2023-11-12: qty 1

## 2023-11-12 MED ORDER — STERILE WATER FOR INJECTION IJ SOLN
INTRAMUSCULAR | Status: AC
  Administered 2023-11-12: 1 mL
  Filled 2023-11-12: qty 10

## 2023-11-12 MED ORDER — ONDANSETRON 4 MG PO TBDP
4.0000 mg | ORAL_TABLET | Freq: Once | ORAL | Status: AC
  Administered 2023-11-12: 4 mg via ORAL
  Filled 2023-11-12: qty 1

## 2023-11-12 MED ORDER — CEFTRIAXONE SODIUM 500 MG IJ SOLR
500.0000 mg | Freq: Once | INTRAMUSCULAR | Status: AC
  Administered 2023-11-12: 500 mg via INTRAMUSCULAR
  Filled 2023-11-12: qty 500

## 2023-11-12 MED ORDER — DOXYCYCLINE HYCLATE 100 MG PO CAPS: 100.0000 mg | ORAL_CAPSULE | Freq: Two times a day (BID) | ORAL | 0 refills | Status: DC

## 2023-11-12 MED ORDER — METRONIDAZOLE 500 MG PO TABS
2000.0000 mg | ORAL_TABLET | Freq: Once | ORAL | Status: AC
Start: 2023-11-12 — End: 2023-11-12
  Administered 2023-11-12: 2000 mg via ORAL
  Filled 2023-11-12: qty 4

## 2023-11-12 NOTE — ED Provider Notes (Signed)
[place] EMERGENCY DEPARTMENT AT [facility] Provider Note   CSN: [id number] Arrival date & time: 11/11/23  2144     History  Chief Complaint  Patient presents with   SEXUALLY TRANSMITTED DISEASE    Adriana Powell is a 22 y.o. female.  Patient presents with concerns over STD.  Patient reports that her sexual partner recently tested positive for gonorrhea and chlamydia and she would like to be tested.  She does not have any symptoms.       Home Medications Prior to Admission medications   Medication Sig Start Date End Date Taking? Authorizing Provider  doxycycline (VIBRAMYCIN) 100 MG capsule Take 1 capsule (100 mg total) by mouth 2 (two) times daily. 11/12/23  Yes Maysen Bonsignore, Canary Brim, MD  FLUoxetine (PROZAC) 10 MG capsule Take 1 capsule (10 mg total) by mouth daily. 04/16/22 05/16/22  Earney Navy, NP  hydrOXYzine (ATARAX) 25 MG tablet Take 1 tablet (25 mg total) by mouth 3 (three) times daily as needed for anxiety. 04/15/22   Earney Navy, NP      Allergies    Hydrocodone    Review of Systems   Review of Systems  Physical Exam Updated Vital Signs BP 116/71 (BP Location: Right Arm)   Pulse 63   Temp 98.5 F (36.9 C) (Oral)   Resp 16   Ht 5\' 3"  (1.6 m)   Wt 72.6 kg   LMP 10/23/2023   SpO2 100%   BMI 28.34 kg/m  Physical Exam Vitals and nursing note reviewed. Exam conducted with a chaperone present.  Constitutional:      General: She is not in acute distress.    Appearance: She is well-developed.  HENT:     Head: Normocephalic and atraumatic.     Mouth/Throat:     Mouth: Mucous membranes are moist.  Eyes:     General: Vision grossly intact. Gaze aligned appropriately.     Extraocular Movements: Extraocular movements intact.     Conjunctiva/sclera: Conjunctivae normal.  Cardiovascular:     Rate and Rhythm: Normal rate and regular rhythm.     Pulses: Normal pulses.     Heart sounds: Normal heart sounds, S1 normal and S2  normal. No murmur heard.    No friction rub. No gallop.  Pulmonary:     Effort: Pulmonary effort is normal. No respiratory distress.     Breath sounds: Normal breath sounds.  Abdominal:     General: Bowel sounds are normal.     Palpations: Abdomen is soft.     Tenderness: There is no abdominal tenderness. There is no guarding or rebound.     Hernia: No hernia is present.  Genitourinary:    Vagina: Normal.     Cervix: Normal.  Musculoskeletal:        General: No swelling.     Cervical back: Full passive range of motion without pain, normal range of motion and neck supple. No spinous process tenderness or muscular tenderness. Normal range of motion.     Right lower leg: No edema.     Left lower leg: No edema.  Skin:    General: Skin is warm and dry.     Capillary Refill: Capillary refill takes less than 2 seconds.     Findings: No ecchymosis, erythema, rash or wound.  Neurological:     General: No focal deficit present.     Mental Status: She is alert and oriented to person, place, and time.     GCS: GCS  eye subscore is 4. GCS verbal subscore is 5. GCS motor subscore is 6.     Cranial Nerves: Cranial nerves 2-12 are intact.     Sensory: Sensation is intact.     Motor: Motor function is intact.     Coordination: Coordination is intact.  Psychiatric:        Attention and Perception: Attention normal.        Mood and Affect: Mood normal.        Speech: Speech normal.        Behavior: Behavior normal.     ED Results / Procedures / Treatments   Labs (all labs ordered are listed, but only abnormal results are displayed) Labs Reviewed  WET PREP, GENITAL - Abnormal; Notable for the following components:      Result Value   Yeast Wet Prep HPF POC PRESENT (*)    WBC, Wet Prep HPF POC >=10 (*)    All other components within normal limits  URINALYSIS, ROUTINE W REFLEX MICROSCOPIC - Abnormal; Notable for the following components:   Ketones, ur 5 (*)    Leukocytes,Ua TRACE (*)     All other components within normal limits  HCG, SERUM, QUALITATIVE  HIV ANTIBODY (ROUTINE TESTING W REFLEX)  RPR  GC/CHLAMYDIA PROBE AMP ([place]) NOT AT [facility]    EKG None  Radiology No results found.  Procedures Procedures    Medications Ordered in ED Medications  metroNIDAZOLE (FLAGYL) tablet 2,000 mg (has no administration in time range)  cefTRIAXone (ROCEPHIN) injection 500 mg (500 mg Intramuscular Given 11/12/23 0033)  doxycycline (VIBRA-TABS) tablet 100 mg (100 mg Oral Given 11/12/23 0032)  sterile water (preservative free) injection (1 mL  Given 11/12/23 0034)    ED Course/ Medical Decision Making/ A&P                                 Medical Decision Making Risk Prescription drug management.   Patient with positive STD contact, asymptomatic.  HIV negative.  RPR pending.  GC and chlamydia collected will treat empirically because of contact.        Final Clinical Impression(s) / ED Diagnoses Final diagnoses:  STD exposure    Rx / DC Orders ED Discharge Orders          Ordered    doxycycline (VIBRAMYCIN) 100 MG capsule  2 times daily        11/12/23 0038              Gilda Crease, MD 11/12/23 [phone]

## 2023-11-14 LAB — T.PALLIDUM AB, TOTAL: T Pallidum Abs: REACTIVE — AB

## 2024-09-10 ENCOUNTER — Encounter (HOSPITAL_COMMUNITY): Payer: Self-pay

## 2024-09-10 ENCOUNTER — Ambulatory Visit (HOSPITAL_COMMUNITY)
Admission: EM | Admit: 2024-09-10 | Discharge: 2024-09-10 | Disposition: A | Discharge status: Home / Self Care | Attending: Physician Assistant | Admitting: Physician Assistant

## 2024-09-10 DIAGNOSIS — R21 Rash and other nonspecific skin eruption: Secondary | ICD-10-CM | POA: Diagnosis not present

## 2024-09-10 DIAGNOSIS — Z113 Encounter for screening for infections with a predominantly sexual mode of transmission: Secondary | ICD-10-CM | POA: Insufficient documentation

## 2024-09-10 DIAGNOSIS — B349 Viral infection, unspecified: Secondary | ICD-10-CM | POA: Insufficient documentation

## 2024-09-10 LAB — HIV ANTIBODY (ROUTINE TESTING W REFLEX): HIV Screen 4th Generation wRfx: NONREACTIVE

## 2024-09-10 NOTE — ED Triage Notes (Signed)
 Patient here today with c/o itchy rash on arms and legs today. Patient did have some itchiness on her arm a couple days ago but did not see a rash. Patient has also had some body pain, stiffness, and weakness since Tuesday. Patient has some tenderness and swelling on the right shoulder. She also states that her eyes have also appear puffy and sensitive to light.   Patient would like to be tested for all Stds.

## 2024-09-10 NOTE — ED Provider Notes (Signed)
 [facility]    CSN: [id number] Arrival date & time: 09/10/24  1532      History   Chief Complaint Chief Complaint  Patient presents with   SEXUALLY TRANSMITTED DISEASE   Rash    HPI Adriana Powell is a 23 y.o. female.   HPI  Pt is here today with several concerns.  She states that she has some swelling along her neck, eye swelling, photophobia, a rash, neck stiffness, body aches. She states the rash started today but the other symptoms have been ongoing for 2 days  She is concerned that the swelling along her neck is a lymph node and she thinks she has one near the groin.  She reports having hot and cold spells and states her whole body and head feel very sensitive.  She reports fatigue and states she has been sleeping a lot over the past 3 days. She states that her eyes feel swollen   She reports her stepbrother has been coughing    she has also had a new sexual partner recently. She states that her new partner has not expressed concerns for STD exposure or symptoms       Past Medical History:  Diagnosis Date   Eczema     Patient Active Problem List   Diagnosis Date Noted   Severe single current episode of major depressive disorder, without psychotic features (HCC) 04/15/2022   Suicide ideation 04/15/2022   Trichomonal cervicitis 01/14/2018    Past Surgical History:  Procedure Laterality Date   TONSILLECTOMY      OB History     Gravida  0   Para  0   Term  0   Preterm  0   AB  0   Living  0      SAB  0   IAB  0   Ectopic  0   Multiple  0   Live Births  0            Home Medications    Prior to Admission medications   Medication Sig Start Date End Date Taking? Authorizing Provider  FLUoxetine  (PROZAC ) 10 MG capsule Take 1 capsule (10 mg total) by mouth daily. Patient not taking: Reported on 09/10/2024 04/16/22 05/16/22  Onuoha, Josephine C, NP  hydrOXYzine  (ATARAX ) 25 MG tablet Take 1 tablet (25 mg total) by mouth  3 (three) times daily as needed for anxiety. Patient not taking: Reported on 09/10/2024 04/15/22   Alan Gailen BROCKS, NP    Family History Family History  Problem Relation Age of Onset   Diabetes Paternal Grandmother     Social History Social History   Tobacco Use   Smoking status: Never    Passive exposure: Yes   Smokeless tobacco: Never  Vaping Use   Vaping status: Never Used  Substance Use Topics   Alcohol use: No   Drug use: Not Currently    Types: Marijuana     Allergies   Hydrocodone   Review of Systems Review of Systems  Constitutional:  Positive for chills and diaphoresis.  HENT:  Positive for congestion, ear pain and rhinorrhea. Negative for sore throat.   Eyes:  Positive for photophobia.  Respiratory:  Negative for cough, shortness of breath and wheezing.   Gastrointestinal:  Positive for diarrhea (she attributes this to her period). Negative for nausea and vomiting.  Musculoskeletal:  Positive for arthralgias, myalgias, neck pain and neck stiffness.  Neurological:  Negative for headaches.  She states her head feels sensitive to touch   Hematological:  Positive for adenopathy.     Physical Exam Triage Vital Signs ED Triage Vitals  Encounter Vitals Group     BP 09/10/24 1634 115/73     Girls Systolic BP Percentile --      Girls Diastolic BP Percentile --      Boys Systolic BP Percentile --      Boys Diastolic BP Percentile --      Pulse Rate 09/10/24 1634 86     Resp 09/10/24 1634 16     Temp 09/10/24 1634 99.6 F (37.6 C)     Temp Source 09/10/24 1634 Oral     SpO2 09/10/24 1634 97 %     Weight --      Height --      Head Circumference --      Peak Flow --      Pain Score 09/10/24 1633 0     Pain Loc --      Pain Education --      Exclude from Growth Chart --    No data found.  Updated Vital Signs BP 115/73 (BP Location: Left Arm)   Pulse 86   Temp 99.6 F (37.6 C) (Oral)   Resp 16   LMP 09/09/2024 (Exact Date)   SpO2 97%    Visual Acuity Right Eye Distance:   Left Eye Distance:   Bilateral Distance:    Right Eye Near:   Left Eye Near:    Bilateral Near:     Physical Exam Vitals reviewed.  Constitutional:      General: She is awake.     Appearance: Normal appearance. She is well-developed and well-groomed.  HENT:     Head: Normocephalic and atraumatic.     Right Ear: Hearing, tympanic membrane and ear canal normal.     Left Ear: Hearing, tympanic membrane and ear canal normal.     Mouth/Throat:     Lips: Pink.     Mouth: Mucous membranes are moist.     Pharynx: Oropharynx is clear. Uvula midline. No pharyngeal swelling, oropharyngeal exudate, posterior oropharyngeal erythema, uvula swelling or postnasal drip.  Eyes:     General: Lids are normal. Gaze aligned appropriately.     Extraocular Movements: Extraocular movements intact.     Conjunctiva/sclera: Conjunctivae normal.     Pupils: Pupils are equal, round, and reactive to light.  Neck:   Cardiovascular:     Rate and Rhythm: Normal rate and regular rhythm.     Pulses: Normal pulses.          Radial pulses are 2+ on the right side and 2+ on the left side.     Heart sounds: Normal heart sounds. No murmur heard.    No friction rub. No gallop.  Pulmonary:     Effort: Pulmonary effort is normal.     Breath sounds: Normal breath sounds. No decreased air movement. No decreased breath sounds, wheezing, rhonchi or rales.  Musculoskeletal:     Cervical back: Normal range of motion and neck supple.  Lymphadenopathy:     Head:     Right side of head: No submental, submandibular or preauricular adenopathy.     Left side of head: No submental, submandibular or preauricular adenopathy.     Cervical:     Right cervical: No superficial cervical adenopathy.    Left cervical: No superficial cervical adenopathy.     Upper Body:     Right  upper body: No supraclavicular adenopathy.     Left upper body: No supraclavicular adenopathy.  Skin:    General:  Skin is warm and dry.      Neurological:     Mental Status: She is alert and oriented to person, place, and time.  Psychiatric:        Attention and Perception: Attention and perception normal.        Mood and Affect: Mood and affect normal.        Speech: Speech normal.        Behavior: Behavior normal. Behavior is cooperative.      UC Treatments / Results  Labs (all labs ordered are listed, but only abnormal results are displayed) Labs Reviewed  HIV ANTIBODY (ROUTINE TESTING W REFLEX)  RPR  CERVICOVAGINAL ANCILLARY ONLY    EKG   Radiology No results found.  Procedures Procedures (including critical care time)  Medications Ordered in UC Medications - No data to display  Initial Impression / Assessment and Plan / UC Course  I have reviewed the triage vital signs and the nursing notes.  Pertinent labs & imaging results that were available during my care of the patient were reviewed by me and considered in my medical decision making (see chart for details).      Final Clinical Impressions(s) / UC Diagnoses   Final diagnoses:  Screening examination for STD (sexually transmitted disease)  Viral illness  Rash and nonspecific skin eruption   Patient presents today with multiple concerns.  She reports that she is having eye swelling, eye irritation and itching, suspected rash, concern for lymphadenopathy and neck pain, generalized bodyaches and tenderness.  She also reports concerns for potential STD infection and would like testing today.  Eyes are largely normal without notable swelling or edema.  EOM's intact bilaterally and she has PERRLA.  Intact ROM with regards to the neck but she does have some mild spasms noted along the right side.  No obvious evidence of lymphadenopathy.  Her rash appears to be comprised of acne along the forearm and elbow area bilaterally.  Reassured patient that this does not appear consistent with hives or HSV infection.  Given diffuse symptoms  and sudden onset I suspect likely viral illness.  Patient denies having lesions or rash in genital area at this time.  Cervicovaginal swab collected to assess for gonorrhea, chlamydia, trichomonas, BV, yeast.  Blood work collected to assess for HIV and syphilis.  Discussed with patient that we will contact her with results once available.  Reviewed with patient that she can have largely OTC medications for supportive measures such as lubricating eyedrops, antihistamine eyedrops and over-the-counter multisymptom medications for suspected viral symptoms such as DayQuil/NyQuil.  ED and return precautions reviewed and provided in AVS.  Follow-up as needed    Discharge Instructions      For your eyes you can use an antihistamine eye drop such as Ketotifen or olopatadine to help with the itching and watery eyes. You can also use lubricating eye drops such as Blink tears or Rephresh eye drops  You were seen today for STD screening.  We collected a cervicovaginal swab that we will assess for gonorrhea, chlamydia, trichomonas, bacterial vaginosis, yeast.  If collected blood work that we will assess for HIV and syphilis.  We will keep you updated with these results once they are available.  If any medications are indicated by those test results we will call you and medications will either be sent to the pharmacy  on file or you can return to the urgent care for an injection.  It is recommended that you refrain from sexual activity until your test results are negative or until you have completed an appropriate medication regimen as dictated by your test results.  Please use a condom or another barrier method to help prevent STD transmission.  Please make sure that you communicate with your partners regarding your test results should any positive results, about as they will also need to be tested and screened.  Based on your described symptoms and the duration of symptoms it is likely that you have a viral upper  respiratory infection (often called a cold)  Symptoms can last for 3-10 days with lingering cough and intermittent symptoms potentially  lasting several  weeks after that.  The goal of treatment at this time is to reduce your symptoms and discomfort   You can use the following medications and measures to help yourself feel better until your body fights this off: DayQuil/NyQuil, TheraFlu, Alka-Seltzer  (these medications typically have the same active ingredients in them so you can choose whichever one you prefer and take consistently during the day and night according to the manufactures instructions.) Flonase A daily antihistamine such as Zyrtec, Claritin, Allegra per your preference.  Please choose 1 and take consistently. Increased fluids.  It is recommended that you take in at least 64 ounces of water  per day when you are not sick so it is important to increase this when you are sick and your body may be running fever. Rest Cough drops Chloraseptic throat spray to help with sore throat Nasal saline spray or nasal flushes to help with congestion and runny nose  If your symptoms seem like they are getting worse over the next 5 to 7 days or not improving you can always follow-up here in urgent care or go to your primary care provider for further management. Go to the ER if you begin to have more serious symptoms such as shortness of breath, trouble breathing, loss of consciousness, swelling around the eyes, high fever, severe lasting headaches, vision changes or neck pain/stiffness.         ED Prescriptions   None    PDMP not reviewed this encounter.   Marylene Rocky BRAVO, PA-C 09/10/24 1815

## 2024-09-10 NOTE — Discharge Instructions (Addendum)
 For your eyes you can use an antihistamine eye drop such as Ketotifen or olopatadine to help with the itching and watery eyes. You can also use lubricating eye drops such as Blink tears or Rephresh eye drops  You were seen today for STD screening.  We collected a cervicovaginal swab that we will assess for gonorrhea, chlamydia, trichomonas, bacterial vaginosis, yeast.  If collected blood work that we will assess for HIV and syphilis.  We will keep you updated with these results once they are available.  If any medications are indicated by those test results we will call you and medications will either be sent to the pharmacy on file or you can return to the urgent care for an injection.  It is recommended that you refrain from sexual activity until your test results are negative or until you have completed an appropriate medication regimen as dictated by your test results.  Please use a condom or another barrier method to help prevent STD transmission.  Please make sure that you communicate with your partners regarding your test results should any positive results, about as they will also need to be tested and screened.  Based on your described symptoms and the duration of symptoms it is likely that you have a viral upper respiratory infection (often called a cold)  Symptoms can last for 3-10 days with lingering cough and intermittent symptoms potentially  lasting several  weeks after that.  The goal of treatment at this time is to reduce your symptoms and discomfort   You can use the following medications and measures to help yourself feel better until your body fights this off: DayQuil/NyQuil, TheraFlu, Alka-Seltzer  (these medications typically have the same active ingredients in them so you can choose whichever one you prefer and take consistently during the day and night according to the manufactures instructions.) Flonase A daily antihistamine such as Zyrtec, Claritin, Allegra per your  preference.  Please choose 1 and take consistently. Increased fluids.  It is recommended that you take in at least 64 ounces of water  per day when you are not sick so it is important to increase this when you are sick and your body may be running fever. Rest Cough drops Chloraseptic throat spray to help with sore throat Nasal saline spray or nasal flushes to help with congestion and runny nose  If your symptoms seem like they are getting worse over the next 5 to 7 days or not improving you can always follow-up here in urgent care or go to your primary care provider for further management. Go to the ER if you begin to have more serious symptoms such as shortness of breath, trouble breathing, loss of consciousness, swelling around the eyes, high fever, severe lasting headaches, vision changes or neck pain/stiffness.

## 2024-09-11 LAB — RPR: RPR Ser Ql: NONREACTIVE

## 2024-09-14 LAB — CERVICOVAGINAL ANCILLARY ONLY
Bacterial Vaginitis (gardnerella): POSITIVE — AB
Candida Glabrata: NEGATIVE
Candida Vaginitis: POSITIVE — AB
Chlamydia: NEGATIVE
Comment: NEGATIVE
Comment: NEGATIVE
Comment: NEGATIVE
Comment: NEGATIVE
Comment: NEGATIVE
Comment: NORMAL
Neisseria Gonorrhea: NEGATIVE
Trichomonas: NEGATIVE

## 2024-09-15 ENCOUNTER — Ambulatory Visit (HOSPITAL_COMMUNITY): Payer: Self-pay

## 2024-09-15 MED ORDER — FLUCONAZOLE 150 MG PO TABS: 150.0000 mg | ORAL_TABLET | Freq: Once | ORAL | 0 refills | Status: AC

## 2024-09-15 MED ORDER — METRONIDAZOLE 500 MG PO TABS: 500.0000 mg | ORAL_TABLET | Freq: Two times a day (BID) | ORAL | 0 refills | Status: AC
